# Patient Record
Sex: Male | Born: 1950 | Race: White | Hispanic: No | Marital: Married | Smoking: Never smoker
Health system: Southern US, Community
[De-identification: ages and names within clinical notes are randomized; demographics above are authoritative.]

## PROBLEM LIST (undated history)

## (undated) DIAGNOSIS — E785 Hyperlipidemia, unspecified: Secondary | ICD-10-CM

## (undated) DIAGNOSIS — I10 Essential (primary) hypertension: Secondary | ICD-10-CM

## (undated) DIAGNOSIS — R7303 Prediabetes: Secondary | ICD-10-CM

## (undated) DIAGNOSIS — N529 Male erectile dysfunction, unspecified: Secondary | ICD-10-CM

## (undated) DIAGNOSIS — M109 Gout, unspecified: Secondary | ICD-10-CM

## (undated) HISTORY — PX: REPLACEMENT TOTAL KNEE: SUR1224

## (undated) HISTORY — PX: TONSILLECTOMY: SUR1361

## (undated) HISTORY — DX: Essential (primary) hypertension: I10

## (undated) HISTORY — DX: Hyperlipidemia, unspecified: E78.5

## (undated) HISTORY — DX: Gout, unspecified: M10.9

## (undated) HISTORY — DX: Prediabetes: R73.03

## (undated) HISTORY — PX: VASECTOMY: SHX75

## (undated) HISTORY — PX: MEDIAL PARTIAL KNEE REPLACEMENT: SHX5965

## (undated) HISTORY — DX: Male erectile dysfunction, unspecified: N52.9

---

## 2009-02-10 ENCOUNTER — Emergency Department (HOSPITAL_BASED_OUTPATIENT_CLINIC_OR_DEPARTMENT_OTHER): Admission: EM | Admit: 2009-02-10 | Discharge: 2009-02-11 | Payer: Self-pay | Admitting: Emergency Medicine

## 2009-02-10 ENCOUNTER — Ambulatory Visit: Payer: Self-pay | Admitting: Diagnostic Radiology

## 2010-12-04 LAB — DIFFERENTIAL
Basophils Absolute: 0 10*3/uL (ref 0.0–0.1)
Eosinophils Relative: 0 % (ref 0–5)
Lymphocytes Relative: 22 % (ref 12–46)
Monocytes Absolute: 0.8 10*3/uL (ref 0.1–1.0)
Monocytes Relative: 10 % (ref 3–12)
Neutro Abs: 5.5 10*3/uL (ref 1.7–7.7)

## 2010-12-04 LAB — POCT CARDIAC MARKERS
CKMB, poc: <1 ng/mL — ABNORMAL LOW (ref 1.0–8.0)
CKMB, poc: <1 ng/mL — ABNORMAL LOW (ref 1.0–8.0)
Myoglobin, poc: 38.8 ng/mL (ref 12–200)
Troponin i, poc: <0.05 ng/mL (ref 0.00–0.09)
Troponin i, poc: <0.05 ng/mL (ref 0.00–0.09)

## 2010-12-04 LAB — BASIC METABOLIC PANEL
CO2: 31 mEq/L (ref 19–32)
Calcium: 9.4 mg/dL (ref 8.4–10.5)
GFR calc Af Amer: >60 mL/min (ref >60)
GFR calc non Af Amer: >60 mL/min (ref >60)
Glucose, Bld: 118 mg/dL — ABNORMAL HIGH (ref 70–99)
Potassium: 4 mEq/L (ref 3.5–5.1)
Sodium: 142 mEq/L (ref 135–145)

## 2010-12-04 LAB — CBC
HCT: 48.2 % (ref 39.0–52.0)
Hemoglobin: 16.1 g/dL (ref 13.0–17.0)
RBC: 5.52 MIL/uL (ref 4.22–5.81)
RDW: 12.4 % (ref 11.5–15.5)

## 2011-01-01 IMAGING — CR DG CHEST 1V PORT
1 series · 1 of 1 positions shown · non-contrast
Comparison: None

CLINICAL DATA: Heartburn.  Diarrhea.

PORTABLE CHEST - 1 VIEW

[view not recorded]
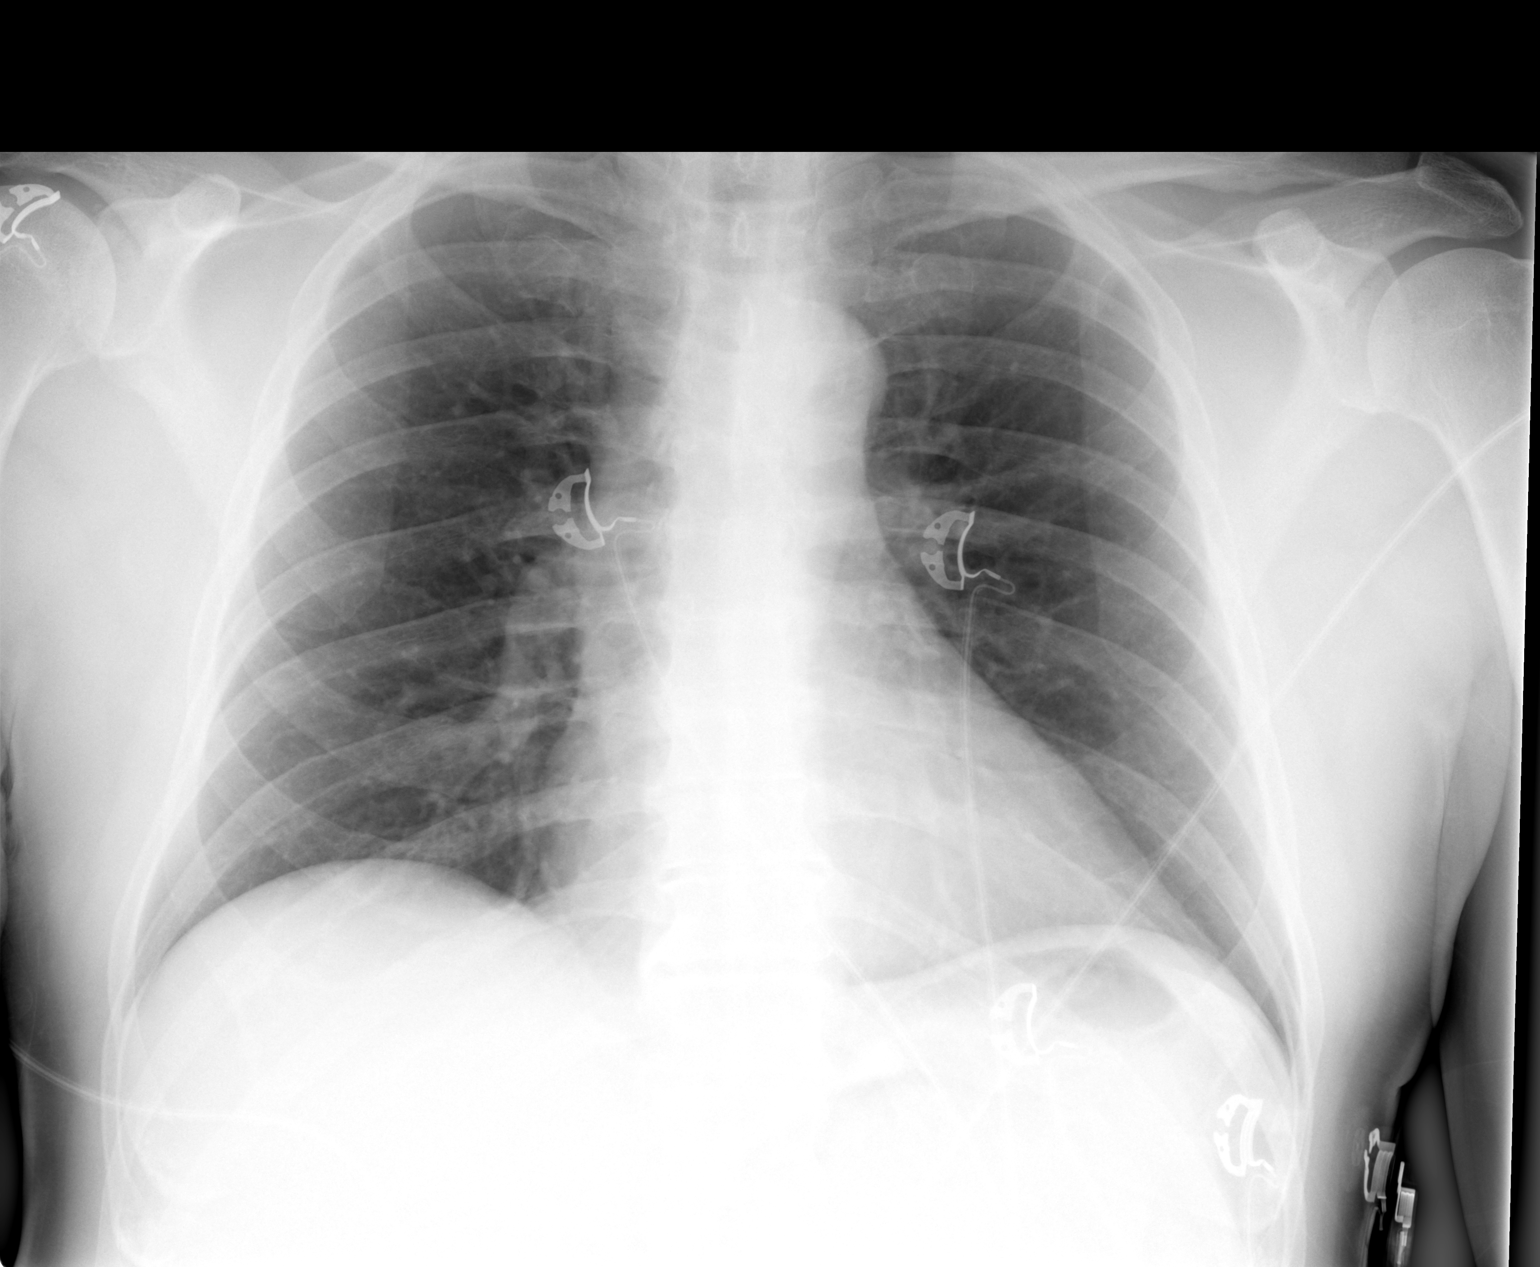

[1 of 1 positions shown; findings below may reference images not displayed]

FINDINGS: 2246 hours.  The heart size and mediastinal contours are
normal.  The lungs are clear.  There is no pleural effusion or
pneumothorax.  The osseous structures appear unremarkable.
IMPRESSION: No active cardiopulmonary process.

## 2014-08-30 ENCOUNTER — Encounter: Payer: Self-pay | Admitting: Podiatry

## 2014-08-30 ENCOUNTER — Ambulatory Visit (INDEPENDENT_AMBULATORY_CARE_PROVIDER_SITE_OTHER): Payer: BLUE CROSS/BLUE SHIELD

## 2014-08-30 ENCOUNTER — Ambulatory Visit (INDEPENDENT_AMBULATORY_CARE_PROVIDER_SITE_OTHER): Payer: BLUE CROSS/BLUE SHIELD | Admitting: Podiatry

## 2014-08-30 VITALS — BP 118/76 | HR 87 | Resp 16 | Ht 68.5 in | Wt 180.0 lb

## 2014-08-30 DIAGNOSIS — M779 Enthesopathy, unspecified: Secondary | ICD-10-CM | POA: Diagnosis not present

## 2014-08-30 DIAGNOSIS — M2041 Other hammer toe(s) (acquired), right foot: Secondary | ICD-10-CM

## 2014-08-30 NOTE — Progress Notes (Signed)
   Subjective:    Patient ID: Kristopher James, male    DOB: 1951/04/22, 64 y.o.   MRN: [id number]  HPI Comments: Right foot pain in the second toe joint, get some soreness in great toe joint. It has been on and off for a couple of years. It was swollen. Switched into different shoes and it has got better   Foot Pain      Review of Systems  All other systems reviewed and are negative.      Objective:   Physical Exam        Assessment & Plan:

## 2014-09-01 NOTE — Progress Notes (Signed)
Subjective:     Patient ID: Kristopher James, male   DOB: [birth year], 64 y.o.   MRN: [id number]  HPI patient presents with long-term inflammation and discomfort in the second metatarsophalangeal joint right with mild discomfort also around the big toe joint which gives him occasional pain   Review of Systems  All other systems reviewed and are negative.      Objective:   Physical Exam  Constitutional: He is oriented to person, place, and time.  Cardiovascular: Intact distal pulses.   Musculoskeletal: Normal range of motion.  Neurological: He is oriented to person, place, and time.  Skin: Skin is warm.  Nursing note and vitals reviewed.  neurovascular status is found to be intact with muscle strength adequate and range of motion subtalar and midtarsal joint within normal limits. Patient is noted to have swelling and discomfort around the second metatarsal phalangeal joint with enlargement of the joint surface and obvious signs of arthritis. 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 At this point I recommended orthotics to try to reduce the pressure against the joint with probable graphite insole to try to reduce the bending forces. I did explain the chronic nature of this condition and that long-term it may require ultimately some form of surgical procedure with joint implantation

## 2014-09-20 ENCOUNTER — Encounter: Payer: Self-pay | Admitting: Podiatry

## 2014-09-20 ENCOUNTER — Ambulatory Visit (INDEPENDENT_AMBULATORY_CARE_PROVIDER_SITE_OTHER): Payer: BLUE CROSS/BLUE SHIELD | Admitting: Podiatry

## 2014-09-20 VITALS — BP 118/76 | HR 87 | Resp 16

## 2014-09-20 DIAGNOSIS — M779 Enthesopathy, unspecified: Secondary | ICD-10-CM | POA: Diagnosis not present

## 2014-09-20 NOTE — Patient Instructions (Signed)

## 2014-09-22 NOTE — Progress Notes (Signed)
Subjective:     Patient ID: Kristopher James, male   DOB: 1950-10-22, 64 y.o.   MRN: [id number]  HPI patient states I'm still having moderate discomfort but improved in my right foot with new tennis shoes   Review of Systems     Objective:   Physical Exam Neurovascular status intact with diminished discomfort right second MPJ with fluid still noted upon palpation    Assessment:     Inflammatory capsulitis right    Plan:     Dispensed orthotics with instructions and gave instructions on continued physical therapy. Reappoint for us to recheck again

## 2014-11-01 ENCOUNTER — Encounter: Payer: Self-pay | Admitting: Podiatry

## 2014-11-01 ENCOUNTER — Ambulatory Visit (INDEPENDENT_AMBULATORY_CARE_PROVIDER_SITE_OTHER): Payer: BLUE CROSS/BLUE SHIELD | Admitting: Podiatry

## 2014-11-01 VITALS — BP 143/97 | HR 86 | Resp 86

## 2014-11-01 DIAGNOSIS — M2041 Other hammer toe(s) (acquired), right foot: Secondary | ICD-10-CM | POA: Diagnosis not present

## 2014-11-01 DIAGNOSIS — M779 Enthesopathy, unspecified: Secondary | ICD-10-CM

## 2014-11-01 NOTE — Progress Notes (Signed)
Subjective:     Patient ID: Kristopher James, male   DOB: [birth year], 64 y.o.   MRN: [id number]  HPI patient states I'm feeling quite a bit better with my right foot with occasional flareups and inability to wear the orthotics and certain types of shoes   Review of Systems     Objective:   Physical Exam Neurovascular status intact muscle strength adequate with significant diminishment of discomfort right second MPJ and mild discomfort on the right first MPJ with crepitus within the joint noted upon dorsiflexion    Assessment:     Continued inflammatory capsulitis with arthritis second MPJ right and hallux limitus deformity right    Plan:     Reviewed both conditions and today 1 ahead and dispensed a graphite insole to reduce plantar pressure and reappoint as symptoms indicate

## 2017-11-04 DIAGNOSIS — G8918 Other acute postprocedural pain: Secondary | ICD-10-CM | POA: Diagnosis not present

## 2017-11-04 DIAGNOSIS — M1712 Unilateral primary osteoarthritis, left knee: Secondary | ICD-10-CM | POA: Diagnosis not present

## 2017-11-08 DIAGNOSIS — M1712 Unilateral primary osteoarthritis, left knee: Secondary | ICD-10-CM | POA: Diagnosis not present

## 2017-11-11 DIAGNOSIS — M1712 Unilateral primary osteoarthritis, left knee: Secondary | ICD-10-CM | POA: Diagnosis not present

## 2017-11-12 DIAGNOSIS — Z96652 Presence of left artificial knee joint: Secondary | ICD-10-CM | POA: Diagnosis not present

## 2017-11-13 DIAGNOSIS — M1712 Unilateral primary osteoarthritis, left knee: Secondary | ICD-10-CM | POA: Diagnosis not present

## 2017-11-15 DIAGNOSIS — M1712 Unilateral primary osteoarthritis, left knee: Secondary | ICD-10-CM | POA: Diagnosis not present

## 2017-11-18 DIAGNOSIS — M1712 Unilateral primary osteoarthritis, left knee: Secondary | ICD-10-CM | POA: Diagnosis not present

## 2017-11-20 DIAGNOSIS — M1712 Unilateral primary osteoarthritis, left knee: Secondary | ICD-10-CM | POA: Diagnosis not present

## 2017-11-22 DIAGNOSIS — M1712 Unilateral primary osteoarthritis, left knee: Secondary | ICD-10-CM | POA: Diagnosis not present

## 2017-11-25 DIAGNOSIS — M1712 Unilateral primary osteoarthritis, left knee: Secondary | ICD-10-CM | POA: Diagnosis not present

## 2017-11-28 DIAGNOSIS — M1712 Unilateral primary osteoarthritis, left knee: Secondary | ICD-10-CM | POA: Diagnosis not present

## 2017-12-04 DIAGNOSIS — M1712 Unilateral primary osteoarthritis, left knee: Secondary | ICD-10-CM | POA: Diagnosis not present

## 2017-12-06 DIAGNOSIS — M1712 Unilateral primary osteoarthritis, left knee: Secondary | ICD-10-CM | POA: Diagnosis not present

## 2017-12-10 DIAGNOSIS — M1712 Unilateral primary osteoarthritis, left knee: Secondary | ICD-10-CM | POA: Diagnosis not present

## 2017-12-12 DIAGNOSIS — M1712 Unilateral primary osteoarthritis, left knee: Secondary | ICD-10-CM | POA: Diagnosis not present

## 2017-12-17 DIAGNOSIS — M1712 Unilateral primary osteoarthritis, left knee: Secondary | ICD-10-CM | POA: Diagnosis not present

## 2017-12-19 DIAGNOSIS — M1712 Unilateral primary osteoarthritis, left knee: Secondary | ICD-10-CM | POA: Diagnosis not present

## 2017-12-21 DIAGNOSIS — M1712 Unilateral primary osteoarthritis, left knee: Secondary | ICD-10-CM | POA: Diagnosis not present

## 2017-12-24 DIAGNOSIS — M1712 Unilateral primary osteoarthritis, left knee: Secondary | ICD-10-CM | POA: Diagnosis not present

## 2017-12-26 DIAGNOSIS — M1712 Unilateral primary osteoarthritis, left knee: Secondary | ICD-10-CM | POA: Diagnosis not present

## 2017-12-31 DIAGNOSIS — M1712 Unilateral primary osteoarthritis, left knee: Secondary | ICD-10-CM | POA: Diagnosis not present

## 2018-01-02 DIAGNOSIS — M1712 Unilateral primary osteoarthritis, left knee: Secondary | ICD-10-CM | POA: Diagnosis not present

## 2018-01-07 DIAGNOSIS — M1712 Unilateral primary osteoarthritis, left knee: Secondary | ICD-10-CM | POA: Diagnosis not present

## 2018-01-10 DIAGNOSIS — M1712 Unilateral primary osteoarthritis, left knee: Secondary | ICD-10-CM | POA: Diagnosis not present

## 2018-01-13 DIAGNOSIS — M1712 Unilateral primary osteoarthritis, left knee: Secondary | ICD-10-CM | POA: Diagnosis not present

## 2018-01-21 DIAGNOSIS — M1712 Unilateral primary osteoarthritis, left knee: Secondary | ICD-10-CM | POA: Diagnosis not present

## 2018-05-14 DIAGNOSIS — I10 Essential (primary) hypertension: Secondary | ICD-10-CM | POA: Diagnosis not present

## 2018-05-14 DIAGNOSIS — Z0000 Encounter for general adult medical examination without abnormal findings: Secondary | ICD-10-CM | POA: Diagnosis not present

## 2018-05-14 DIAGNOSIS — Z1211 Encounter for screening for malignant neoplasm of colon: Secondary | ICD-10-CM | POA: Diagnosis not present

## 2018-05-14 DIAGNOSIS — E7800 Pure hypercholesterolemia, unspecified: Secondary | ICD-10-CM | POA: Diagnosis not present

## 2018-06-12 DIAGNOSIS — H2513 Age-related nuclear cataract, bilateral: Secondary | ICD-10-CM | POA: Diagnosis not present

## 2018-09-10 DIAGNOSIS — G8929 Other chronic pain: Secondary | ICD-10-CM | POA: Diagnosis not present

## 2018-09-10 DIAGNOSIS — M1711 Unilateral primary osteoarthritis, right knee: Secondary | ICD-10-CM | POA: Diagnosis not present

## 2019-05-18 DIAGNOSIS — I10 Essential (primary) hypertension: Secondary | ICD-10-CM | POA: Diagnosis not present

## 2019-05-18 DIAGNOSIS — M1000 Idiopathic gout, unspecified site: Secondary | ICD-10-CM | POA: Diagnosis not present

## 2019-05-18 DIAGNOSIS — E7800 Pure hypercholesterolemia, unspecified: Secondary | ICD-10-CM | POA: Diagnosis not present

## 2019-05-18 DIAGNOSIS — Z0000 Encounter for general adult medical examination without abnormal findings: Secondary | ICD-10-CM | POA: Diagnosis not present

## 2019-11-18 DIAGNOSIS — Z0120 Encounter for dental examination and cleaning without abnormal findings: Secondary | ICD-10-CM | POA: Diagnosis not present

## 2020-03-28 DIAGNOSIS — S91332A Puncture wound without foreign body, left foot, initial encounter: Secondary | ICD-10-CM | POA: Diagnosis not present

## 2020-05-19 DIAGNOSIS — Z0120 Encounter for dental examination and cleaning without abnormal findings: Secondary | ICD-10-CM | POA: Diagnosis not present

## 2020-06-24 DIAGNOSIS — Z0000 Encounter for general adult medical examination without abnormal findings: Secondary | ICD-10-CM | POA: Diagnosis not present

## 2020-06-24 DIAGNOSIS — N529 Male erectile dysfunction, unspecified: Secondary | ICD-10-CM | POA: Diagnosis not present

## 2020-06-24 DIAGNOSIS — E7800 Pure hypercholesterolemia, unspecified: Secondary | ICD-10-CM | POA: Diagnosis not present

## 2020-06-24 DIAGNOSIS — I10 Essential (primary) hypertension: Secondary | ICD-10-CM | POA: Diagnosis not present

## 2020-11-21 DIAGNOSIS — Z01812 Encounter for preprocedural laboratory examination: Secondary | ICD-10-CM | POA: Diagnosis not present

## 2020-11-23 DIAGNOSIS — D124 Benign neoplasm of descending colon: Secondary | ICD-10-CM | POA: Diagnosis not present

## 2020-11-23 DIAGNOSIS — D123 Benign neoplasm of transverse colon: Secondary | ICD-10-CM | POA: Diagnosis not present

## 2020-11-23 DIAGNOSIS — D122 Benign neoplasm of ascending colon: Secondary | ICD-10-CM | POA: Diagnosis not present

## 2020-11-23 DIAGNOSIS — K621 Rectal polyp: Secondary | ICD-10-CM | POA: Diagnosis not present

## 2020-11-23 DIAGNOSIS — K5730 Diverticulosis of large intestine without perforation or abscess without bleeding: Secondary | ICD-10-CM | POA: Diagnosis not present

## 2020-11-23 DIAGNOSIS — Z86010 Personal history of colonic polyps: Secondary | ICD-10-CM | POA: Diagnosis not present

## 2020-11-25 DIAGNOSIS — K621 Rectal polyp: Secondary | ICD-10-CM | POA: Diagnosis not present

## 2020-11-25 DIAGNOSIS — D123 Benign neoplasm of transverse colon: Secondary | ICD-10-CM | POA: Diagnosis not present

## 2020-11-25 DIAGNOSIS — D124 Benign neoplasm of descending colon: Secondary | ICD-10-CM | POA: Diagnosis not present

## 2020-11-25 DIAGNOSIS — D122 Benign neoplasm of ascending colon: Secondary | ICD-10-CM | POA: Diagnosis not present

## 2020-12-27 DIAGNOSIS — H524 Presbyopia: Secondary | ICD-10-CM | POA: Diagnosis not present

## 2020-12-27 DIAGNOSIS — H5203 Hypermetropia, bilateral: Secondary | ICD-10-CM | POA: Diagnosis not present

## 2020-12-27 DIAGNOSIS — H52223 Regular astigmatism, bilateral: Secondary | ICD-10-CM | POA: Diagnosis not present

## 2020-12-27 DIAGNOSIS — H2513 Age-related nuclear cataract, bilateral: Secondary | ICD-10-CM | POA: Diagnosis not present

## 2020-12-31 DIAGNOSIS — Z20822 Contact with and (suspected) exposure to covid-19: Secondary | ICD-10-CM | POA: Diagnosis not present

## 2021-01-31 DIAGNOSIS — M1711 Unilateral primary osteoarthritis, right knee: Secondary | ICD-10-CM | POA: Diagnosis not present

## 2021-06-27 DIAGNOSIS — M1000 Idiopathic gout, unspecified site: Secondary | ICD-10-CM | POA: Diagnosis not present

## 2021-06-27 DIAGNOSIS — E7800 Pure hypercholesterolemia, unspecified: Secondary | ICD-10-CM | POA: Diagnosis not present

## 2021-06-27 DIAGNOSIS — I10 Essential (primary) hypertension: Secondary | ICD-10-CM | POA: Diagnosis not present

## 2021-06-27 DIAGNOSIS — R972 Elevated prostate specific antigen [PSA]: Secondary | ICD-10-CM | POA: Diagnosis not present

## 2021-08-10 DIAGNOSIS — M1711 Unilateral primary osteoarthritis, right knee: Secondary | ICD-10-CM | POA: Diagnosis not present

## 2021-09-04 DIAGNOSIS — G8918 Other acute postprocedural pain: Secondary | ICD-10-CM | POA: Diagnosis not present

## 2021-09-04 DIAGNOSIS — Z96651 Presence of right artificial knee joint: Secondary | ICD-10-CM | POA: Diagnosis not present

## 2021-09-04 DIAGNOSIS — M1711 Unilateral primary osteoarthritis, right knee: Secondary | ICD-10-CM | POA: Diagnosis not present

## 2021-09-12 DIAGNOSIS — M1711 Unilateral primary osteoarthritis, right knee: Secondary | ICD-10-CM | POA: Diagnosis not present

## 2021-09-14 DIAGNOSIS — M25461 Effusion, right knee: Secondary | ICD-10-CM | POA: Diagnosis not present

## 2021-09-14 DIAGNOSIS — Z96651 Presence of right artificial knee joint: Secondary | ICD-10-CM | POA: Diagnosis not present

## 2021-09-15 DIAGNOSIS — M1711 Unilateral primary osteoarthritis, right knee: Secondary | ICD-10-CM | POA: Diagnosis not present

## 2021-09-18 DIAGNOSIS — M1711 Unilateral primary osteoarthritis, right knee: Secondary | ICD-10-CM | POA: Diagnosis not present

## 2021-09-20 DIAGNOSIS — M1711 Unilateral primary osteoarthritis, right knee: Secondary | ICD-10-CM | POA: Diagnosis not present

## 2021-09-22 DIAGNOSIS — M1711 Unilateral primary osteoarthritis, right knee: Secondary | ICD-10-CM | POA: Diagnosis not present

## 2021-09-25 DIAGNOSIS — M1711 Unilateral primary osteoarthritis, right knee: Secondary | ICD-10-CM | POA: Diagnosis not present

## 2021-09-27 DIAGNOSIS — M1711 Unilateral primary osteoarthritis, right knee: Secondary | ICD-10-CM | POA: Diagnosis not present

## 2021-09-29 DIAGNOSIS — M1711 Unilateral primary osteoarthritis, right knee: Secondary | ICD-10-CM | POA: Diagnosis not present

## 2021-10-02 DIAGNOSIS — M1711 Unilateral primary osteoarthritis, right knee: Secondary | ICD-10-CM | POA: Diagnosis not present

## 2021-10-04 DIAGNOSIS — M1711 Unilateral primary osteoarthritis, right knee: Secondary | ICD-10-CM | POA: Diagnosis not present

## 2021-10-06 DIAGNOSIS — M1711 Unilateral primary osteoarthritis, right knee: Secondary | ICD-10-CM | POA: Diagnosis not present

## 2021-10-10 DIAGNOSIS — M1711 Unilateral primary osteoarthritis, right knee: Secondary | ICD-10-CM | POA: Diagnosis not present

## 2021-10-12 DIAGNOSIS — M1711 Unilateral primary osteoarthritis, right knee: Secondary | ICD-10-CM | POA: Diagnosis not present

## 2021-10-17 DIAGNOSIS — M1711 Unilateral primary osteoarthritis, right knee: Secondary | ICD-10-CM | POA: Diagnosis not present

## 2021-10-19 DIAGNOSIS — M1711 Unilateral primary osteoarthritis, right knee: Secondary | ICD-10-CM | POA: Diagnosis not present

## 2021-10-23 DIAGNOSIS — M1711 Unilateral primary osteoarthritis, right knee: Secondary | ICD-10-CM | POA: Diagnosis not present

## 2021-10-26 DIAGNOSIS — M1711 Unilateral primary osteoarthritis, right knee: Secondary | ICD-10-CM | POA: Diagnosis not present

## 2021-10-31 DIAGNOSIS — M1711 Unilateral primary osteoarthritis, right knee: Secondary | ICD-10-CM | POA: Diagnosis not present

## 2021-11-03 DIAGNOSIS — M1711 Unilateral primary osteoarthritis, right knee: Secondary | ICD-10-CM | POA: Diagnosis not present

## 2021-11-06 DIAGNOSIS — M1711 Unilateral primary osteoarthritis, right knee: Secondary | ICD-10-CM | POA: Diagnosis not present

## 2021-11-08 DIAGNOSIS — M1711 Unilateral primary osteoarthritis, right knee: Secondary | ICD-10-CM | POA: Diagnosis not present

## 2021-11-13 DIAGNOSIS — M1711 Unilateral primary osteoarthritis, right knee: Secondary | ICD-10-CM | POA: Diagnosis not present

## 2021-11-16 DIAGNOSIS — M1711 Unilateral primary osteoarthritis, right knee: Secondary | ICD-10-CM | POA: Diagnosis not present

## 2021-11-20 DIAGNOSIS — M1711 Unilateral primary osteoarthritis, right knee: Secondary | ICD-10-CM | POA: Diagnosis not present

## 2021-11-22 DIAGNOSIS — M1711 Unilateral primary osteoarthritis, right knee: Secondary | ICD-10-CM | POA: Diagnosis not present

## 2021-11-27 DIAGNOSIS — M1711 Unilateral primary osteoarthritis, right knee: Secondary | ICD-10-CM | POA: Diagnosis not present

## 2021-11-29 DIAGNOSIS — M1711 Unilateral primary osteoarthritis, right knee: Secondary | ICD-10-CM | POA: Diagnosis not present

## 2021-12-04 DIAGNOSIS — M1711 Unilateral primary osteoarthritis, right knee: Secondary | ICD-10-CM | POA: Diagnosis not present

## 2021-12-07 DIAGNOSIS — M1711 Unilateral primary osteoarthritis, right knee: Secondary | ICD-10-CM | POA: Diagnosis not present

## 2021-12-11 DIAGNOSIS — M1711 Unilateral primary osteoarthritis, right knee: Secondary | ICD-10-CM | POA: Diagnosis not present

## 2022-04-12 DIAGNOSIS — H35722 Serous detachment of retinal pigment epithelium, left eye: Secondary | ICD-10-CM | POA: Diagnosis not present

## 2022-04-12 DIAGNOSIS — H25011 Cortical age-related cataract, right eye: Secondary | ICD-10-CM | POA: Diagnosis not present

## 2022-04-12 DIAGNOSIS — H25042 Posterior subcapsular polar age-related cataract, left eye: Secondary | ICD-10-CM | POA: Diagnosis not present

## 2022-04-12 DIAGNOSIS — H2513 Age-related nuclear cataract, bilateral: Secondary | ICD-10-CM | POA: Diagnosis not present

## 2022-06-05 DIAGNOSIS — H35722 Serous detachment of retinal pigment epithelium, left eye: Secondary | ICD-10-CM | POA: Diagnosis not present

## 2022-07-02 DIAGNOSIS — R251 Tremor, unspecified: Secondary | ICD-10-CM | POA: Diagnosis not present

## 2022-07-02 DIAGNOSIS — E7800 Pure hypercholesterolemia, unspecified: Secondary | ICD-10-CM | POA: Diagnosis not present

## 2022-07-02 DIAGNOSIS — I10 Essential (primary) hypertension: Secondary | ICD-10-CM | POA: Diagnosis not present

## 2022-07-02 DIAGNOSIS — Z125 Encounter for screening for malignant neoplasm of prostate: Secondary | ICD-10-CM | POA: Diagnosis not present

## 2022-07-02 DIAGNOSIS — Z0000 Encounter for general adult medical examination without abnormal findings: Secondary | ICD-10-CM | POA: Diagnosis not present

## 2022-08-02 ENCOUNTER — Encounter: Payer: Self-pay | Admitting: *Deleted

## 2022-08-06 ENCOUNTER — Encounter: Payer: Self-pay | Admitting: Neurology

## 2022-08-06 ENCOUNTER — Ambulatory Visit: Payer: Medicare Other | Admitting: Neurology

## 2022-08-06 VITALS — BP 164/91 | HR 74 | Ht 68.0 in | Wt 196.4 lb

## 2022-08-06 DIAGNOSIS — G250 Essential tremor: Secondary | ICD-10-CM

## 2022-08-06 MED ORDER — PROPRANOLOL HCL 20 MG PO TABS: 20.0000 mg | ORAL_TABLET | Freq: Three times a day (TID) | ORAL | 2 refills | Status: AC | PRN

## 2022-08-06 NOTE — Progress Notes (Signed)
Subjective:    Patient ID: Kristopher James is a 71 y.o. male.  HPI    Star Age, MD, PhD [facility] [address], [address] [place], [name] [address]  Dear Dr. Olen Pel,  I saw your patient, Kristopher James, upon your kind request in my neurologic clinic today for initial consultation of his tremors.  The patient is unaccompanied today.  As you know, Kristopher James is a 71 year old male with an underlying medical history of hypertension, hyperlipidemia, prediabetes, gout, ED, status post partial left knee replacement, status post right total knee replacement, and overweight state, who reports a longstanding history of hand tremors of approximately 30 years duration.  As he recalls, his father had tremors.  He has 2 grown children, son is 47 and daughter just turned 60 and she recently mentioned to him that she may also have the same type of tremor.  He has been checked out for tremor several times in different states as he lived in different places over time and travel to different places over time for his job, he has been told several times that he has essential tremor.  He is not particularly worried that he reports that his wife would like him to be checked.  He has never actually tried any medication such as a beta-blocker.  This has been mentioned to him before but he never felt the need for a symptomatic medication.  He has noticed progression particularly in the past year.  He has reduced his caffeine intake.  He drinks 1 cup of regular coffee and 1 cup of decaf coffee on an average morning.  He does like to drink diet green tea, switched from diet soda.  He tries to hydrate well with water but could do better by self admission.  He does drink alcohol every day, usually 1 glass of wine and 1 bourbon.  While he has noticed tremor reduction with alcohol, he has never used alcohol as a therapeutic agent.  He is retired, he worked in Pharmacologist, and [employer].   He lives with his wife, they have 2 grown children.  He likes to travel.  He has not noticed any problems with his balance, no recent falls, he tries to stay active, he plays golf. He has 1 brother who does not have any tremor. I reviewed your office note from 07/02/2022.  He had blood work at the time including CMP, lipid panel.  I reviewed the results, lipid panel showed total cholesterol of 192, triglycerides 112, LDL 105, PSA in the normal range, CMP showed glucose of 113, BUN 17, creatinine 0.82, alk phos 69, AST 23, ALT 35.  He has not tried any medication for the tremor. He sleeps well, some snoring reported but no sleep disruption, feels well rested.  No witnessed apneas.  Denies waking up with a sense of gasping for air.  His Past Medical History Is Significant For: Past Medical History:  Diagnosis Date   ED (erectile dysfunction)    Gout    Hyperlipidemia    Hypertension    Prediabetes     His Past Surgical History Is Significant For: Past Surgical History:  Procedure Laterality Date   MEDIAL PARTIAL KNEE REPLACEMENT Left    REPLACEMENT TOTAL KNEE Right    TONSILLECTOMY     VASECTOMY      His Family History Is Significant For: Family History  Problem Relation Age of Onset   Cancer Mother    Heart attack Father  Tremor Father    Hypertension Brother     His Social History Is Significant For: Social History   Socioeconomic History   Marital status: Married    Spouse name: Not on file   Number of children: Not on file   Years of education: Not on file   Highest education level: Not on file  Occupational History   Not on file  Tobacco Use   Smoking status: Never   Smokeless tobacco: Never  Vaping Use   Vaping Use: Never used  Substance and Sexual Activity   Alcohol use: Yes    Comment: 2 drinks per night   Drug use: Never   Sexual activity: Not on file  Other Topics Concern   Not on file  Social History Narrative   Caffiene coffee 2 cups   Education:  masters in Psychologist, forensic   Retired 19 yrs ago   Social Determinants of Radio broadcast assistant Strain: Not on file  Food Insecurity: Not on file  Transportation Needs: Not on file  Physical Activity: Not on file  Stress: Not on file  Social Connections: Not on file    His Allergies Are:  No Known Allergies:   His Current Medications Are:  Outpatient Encounter Medications as of 08/06/2022  Medication Sig   amLODipine-benazepril (LOTREL) 5-10 MG per capsule    atorvastatin (LIPITOR) 20 MG tablet    glucosamine-chondroitin 500-400 MG tablet Take 1 tablet by mouth daily at 6 (six) AM.   Multiple Vitamins-Minerals (CENTRUM ADULT PO) Take by mouth daily at 6 (six) AM.   colchicine 0.6 MG tablet Take 0.6 mg by mouth daily. (Patient not taking: Reported on 08/06/2022)   [DISCONTINUED] aspirin 81 MG tablet Take 81 mg by mouth daily. (Patient not taking: Reported on 08/06/2022)   No facility-administered encounter medications on file as of 08/06/2022.  :   Review of Systems:  Out of a complete 14 point review of systems, all are reviewed and negative with the exception of these symptoms as listed below:  Review of Systems  Neurological:        Worsening tremors.  (Started about 30 yrs ago). R arm/hand.      Objective:  Neurological Exam  Physical Exam Physical Examination:   Vitals:   08/06/22 0810  BP: (!) 164/91  Pulse: 74    General Examination: The patient is a very pleasant 71 y.o. male in no acute distress. He appears well-developed and well-nourished and well groomed.   HEENT: Normocephalic, atraumatic, pupils are equal, round and reactive to light, correct of eyeglasses in place.  Extraocular tracking is good without limitation to gaze excursion or nystagmus noted. Hearing is grossly intact. Face is symmetric with normal facial animation. Speech is clear with no dysarthria noted. There is no hypophonia. There is no lip, neck/head, jaw or voice  tremor. Neck is supple with full range of passive and active motion. There are no carotid bruits on auscultation. Oropharynx exam reveals: moderate mouth dryness, adequate dental hygiene. Tongue protrudes centrally and palate elevates symmetrically.   Chest: Clear to auscultation without wheezing, rhonchi or crackles noted.  Heart: S1+S2+0, regular and normal without murmurs, rubs or gallops noted.   Abdomen: Soft, non-tender and non-distended.  Extremities: There is no pitting edema in the distal lower extremities bilaterally.   Skin: Warm and dry without trophic changes noted.   Musculoskeletal: exam reveals no obvious joint deformities.   Neurologically:  Mental status: The patient is awake, alert and oriented  in all 4 spheres. His immediate and remote memory, attention, language skills and fund of knowledge are appropriate. There is no evidence of aphasia, agnosia, apraxia or anomia. Speech is clear with normal prosody and enunciation. Thought process is linear. Mood is normal and affect is normal.  Cranial nerves II - XII are as described above under HEENT exam.  Motor exam: Normal bulk, strength and tone is noted. There is no resting tremor.  He has a mild to moderate postural tremor and action tremor in both upper extremities, no lower extremity tremor.    On 08/06/2022: On Archimedes spiral drawing he has mild to moderate trembling with the right hand, moderate with the left hand, handwriting is legible, mildly tremulous, not micrographic.    Fine motor skills and coordination: Normal finger taps, hand movements and rapid alternating patting with both upper extremities, normal foot taps bilaterally.  Reflexes are 2+ in the upper extremities, absent in the knees, 1+ in both ankles.  Cerebellar testing: No dysmetria or intention tremor. There is no truncal or gait ataxia.  Sensory exam: intact to light touch in the upper and lower extremities.  Gait, station and balance: He stands  easily. No veering to one side is noted. No leaning to one side is noted. Posture is age-appropriate and stance is narrow based. Gait shows normal stride length and normal pace. No problems turning are noted.  Romberg is negative.  Assessment and Plan:  In summary, Kristopher James is a very pleasant 71 y.o.-year old male with an underlying medical history of hypertension, hyperlipidemia, prediabetes, gout, ED, status post partial left knee replacement, status post right total knee replacement, and overweight state, who presents for evaluation of his tremor disorder of over 30 years duration with a family history of tremors in his father and potentially also in his daughter.  History and examination are in keeping with essential tremor.  I did not detect any evidence of parkinsonism, history is also not supportive of parkinsonian symptoms.  He was reassured in that regard.  We talked about tremor triggers and alleviating factors, we also talked about symptomatic treatment options.  He knows that there is no curative options.  He has never tried a beta-blocker and would be amenable to starting on a low-dose beta-blocker, even only as needed.  He is advised to stay well-hydrated and well rested and follow-up with you on a scheduled basis.  If he does well with propranolol, we can have him follow-up with you and request refills on his beta-blocker through your office if possible.  He knows not to utilize alcohol as a therapeutic agent.  I suggested he start Inderal/propranolol 20 mg strength immediate release take 1 pill once daily as needed, may take up to 1 pill 3 times a day as needed.  We talked about possible side effects and limitations of the medication, he was given a new prescription and instructions in MyChart.  He is advised to follow-up routinely with your office, I would be happy to see him back as needed.  I did not see a recent TSH, with his next blood draw, please make sure his TSH/thyroid function are  within normal limits.  I answered all his questions today and he was in agreement.  Thank you very much for allowing me to participate in the care of this nice patient. If I can be of any further assistance to you please do not hesitate to call me at [phone].  Sincerely,   Star Age, MD, PhD

## 2022-08-06 NOTE — Patient Instructions (Addendum)
You have a tremor of both hands, likely essential tremor.  I do not see any signs or symptoms of parkinson's like disease or what we call parkinsonism.  For your tremor, we can try you on low-dose propranolol which is a beta-blocker.    You can use the beta-blocker as needed as well, start with 1 pill once daily and gradually build it up to 1 pill 3 times daily.  The medication is also called Inderal (generic name: propranolol) 20 mg strength: take 1 pill up to 3 times a day as needed.   Common side effect reported are: lethargy, sedation, low blood pressure and low pulse rate. Please monitor your BP and Pulse every few days, if your pulse drops lower than 55 you may feel bad and we may have to adjust your dose. Same with your BP below 110/55.   Please remember, that any kind of tremor may be exacerbated by anxiety, anger, nervousness, excitement, dehydration, sleep deprivation, thyroid dysfunction, by caffeine, and low blood sugar values or blood sugar fluctuations. Some medications can exacerbate tremors, this includes certain asthma or COPD medications and certain antidepressants.   For now, you can follow-up with your primary care, if Dr. Lenise Arena is comfortable maintaining your prescription for propranolol, he can provide refills in the future.  Please make sure that your thyroid function is checked with the next blood work.

## 2022-08-09 DIAGNOSIS — Z23 Encounter for immunization: Secondary | ICD-10-CM | POA: Diagnosis not present

## 2022-08-30 ENCOUNTER — Encounter: Payer: Self-pay | Admitting: *Deleted

## 2022-08-30 ENCOUNTER — Telehealth: Payer: Self-pay | Admitting: *Deleted

## 2022-08-30 NOTE — Patient Outreach (Signed)
  Care Coordination   Initial Visit Note   08/30/2022 Name: Kristopher James MRN: [id number] DOB: [birth year]  Kristopher James is a 72 y.o. year old male who sees Briscoe Deutscher, MD for primary care. I spoke with  Ilean China by phone today.  What matters to the patients health and wellness today?  No needs    Goals Addressed             This Visit's Progress    COMPLETED: care coordination activity       Care Coordination Interventions: Reviewed medications with patient and discussed adherence with no needed refills Assessed social determinant of health barriers Educated on care management services with no needs presented at this time.          SDOH assessments and interventions completed:  Yes  SDOH Interventions Today    Flowsheet Row Most Recent Value  SDOH Interventions   Food Insecurity Interventions Intervention Not Indicated  Housing Interventions Intervention Not Indicated  Transportation Interventions Intervention Not Indicated  Utilities Interventions Intervention Not Indicated        Care Coordination Interventions:  Yes, provided   Follow up plan: No further intervention required.   Encounter Outcome:  Pt. Visit Completed   Raina Mina, RN Care Management Coordinator [place] Office [phone]

## 2022-08-30 NOTE — Patient Instructions (Signed)
Visit Information  Thank you for taking time to visit with me today. Please don't hesitate to contact me if I can be of assistance to you.   Following are the goals we discussed today:   Goals Addressed             This Visit's Progress    COMPLETED: care coordination activity       Care Coordination Interventions: Reviewed medications with patient and discussed adherence with no needed refills Assessed social determinant of health barriers Educated on care management services with no needs presented at this time.          Please call the care guide team at [phone] if you need to cancel or reschedule your appointment.   If you are experiencing a Mental Health or [place] or need someone to talk to, please call the Suicide and Crisis Lifeline: 988  The patient verbalized understanding of instructions, educational materials, and care plan provided today and DECLINED offer to receive copy of patient instructions, educational materials, and care plan.   No further follow up required: No follow needs  Raina Mina, RN Care Management Coordinator [place] Office [phone]

## 2022-09-03 DIAGNOSIS — K080 Exfoliation of teeth due to systemic causes: Secondary | ICD-10-CM | POA: Diagnosis not present

## 2022-09-07 DIAGNOSIS — Z96651 Presence of right artificial knee joint: Secondary | ICD-10-CM | POA: Diagnosis not present

## 2022-11-20 DIAGNOSIS — H35722 Serous detachment of retinal pigment epithelium, left eye: Secondary | ICD-10-CM | POA: Diagnosis not present

## 2023-01-02 DIAGNOSIS — K080 Exfoliation of teeth due to systemic causes: Secondary | ICD-10-CM | POA: Diagnosis not present

## 2023-05-21 DIAGNOSIS — H5203 Hypermetropia, bilateral: Secondary | ICD-10-CM | POA: Diagnosis not present

## 2023-05-21 DIAGNOSIS — H353121 Nonexudative age-related macular degeneration, left eye, early dry stage: Secondary | ICD-10-CM | POA: Diagnosis not present

## 2023-05-21 DIAGNOSIS — H52223 Regular astigmatism, bilateral: Secondary | ICD-10-CM | POA: Diagnosis not present

## 2023-05-21 DIAGNOSIS — H25011 Cortical age-related cataract, right eye: Secondary | ICD-10-CM | POA: Diagnosis not present

## 2023-05-21 DIAGNOSIS — H2513 Age-related nuclear cataract, bilateral: Secondary | ICD-10-CM | POA: Diagnosis not present

## 2023-05-21 DIAGNOSIS — H524 Presbyopia: Secondary | ICD-10-CM | POA: Diagnosis not present

## 2023-05-21 DIAGNOSIS — H35722 Serous detachment of retinal pigment epithelium, left eye: Secondary | ICD-10-CM | POA: Diagnosis not present

## 2023-06-19 DIAGNOSIS — K080 Exfoliation of teeth due to systemic causes: Secondary | ICD-10-CM | POA: Diagnosis not present

## 2023-07-10 DIAGNOSIS — R7301 Impaired fasting glucose: Secondary | ICD-10-CM | POA: Diagnosis not present

## 2023-07-10 DIAGNOSIS — M1000 Idiopathic gout, unspecified site: Secondary | ICD-10-CM | POA: Diagnosis not present

## 2023-07-10 DIAGNOSIS — I10 Essential (primary) hypertension: Secondary | ICD-10-CM | POA: Diagnosis not present

## 2023-07-10 DIAGNOSIS — Z0000 Encounter for general adult medical examination without abnormal findings: Secondary | ICD-10-CM | POA: Diagnosis not present

## 2023-07-10 DIAGNOSIS — E7800 Pure hypercholesterolemia, unspecified: Secondary | ICD-10-CM | POA: Diagnosis not present

## 2023-07-10 DIAGNOSIS — Z125 Encounter for screening for malignant neoplasm of prostate: Secondary | ICD-10-CM | POA: Diagnosis not present

## 2023-07-11 DIAGNOSIS — R7301 Impaired fasting glucose: Secondary | ICD-10-CM | POA: Diagnosis not present

## 2023-10-07 DIAGNOSIS — R972 Elevated prostate specific antigen [PSA]: Secondary | ICD-10-CM | POA: Diagnosis not present

## 2023-11-19 DIAGNOSIS — N400 Enlarged prostate without lower urinary tract symptoms: Secondary | ICD-10-CM | POA: Diagnosis not present

## 2023-11-19 DIAGNOSIS — R972 Elevated prostate specific antigen [PSA]: Secondary | ICD-10-CM | POA: Diagnosis not present

## 2023-11-19 DIAGNOSIS — N5201 Erectile dysfunction due to arterial insufficiency: Secondary | ICD-10-CM | POA: Diagnosis not present

## 2023-12-31 DIAGNOSIS — K080 Exfoliation of teeth due to systemic causes: Secondary | ICD-10-CM | POA: Diagnosis not present

## 2024-04-02 DIAGNOSIS — R251 Tremor, unspecified: Secondary | ICD-10-CM | POA: Diagnosis not present

## 2024-04-02 DIAGNOSIS — R972 Elevated prostate specific antigen [PSA]: Secondary | ICD-10-CM | POA: Diagnosis not present

## 2024-04-02 DIAGNOSIS — E7800 Pure hypercholesterolemia, unspecified: Secondary | ICD-10-CM | POA: Diagnosis not present

## 2024-04-02 DIAGNOSIS — I10 Essential (primary) hypertension: Secondary | ICD-10-CM | POA: Diagnosis not present

## 2024-05-03 DIAGNOSIS — R319 Hematuria, unspecified: Secondary | ICD-10-CM | POA: Diagnosis not present

## 2024-05-03 DIAGNOSIS — R1010 Upper abdominal pain, unspecified: Secondary | ICD-10-CM | POA: Diagnosis not present

## 2024-05-12 DIAGNOSIS — R972 Elevated prostate specific antigen [PSA]: Secondary | ICD-10-CM | POA: Diagnosis not present

## 2024-05-19 DIAGNOSIS — R972 Elevated prostate specific antigen [PSA]: Secondary | ICD-10-CM | POA: Diagnosis not present

## 2024-05-19 DIAGNOSIS — N400 Enlarged prostate without lower urinary tract symptoms: Secondary | ICD-10-CM | POA: Diagnosis not present

## 2024-05-19 DIAGNOSIS — N5201 Erectile dysfunction due to arterial insufficiency: Secondary | ICD-10-CM | POA: Diagnosis not present

## 2024-05-20 ENCOUNTER — Other Ambulatory Visit: Payer: Self-pay | Admitting: Urology

## 2024-05-20 ENCOUNTER — Encounter: Payer: Self-pay | Admitting: Urology

## 2024-05-20 DIAGNOSIS — R972 Elevated prostate specific antigen [PSA]: Secondary | ICD-10-CM

## 2024-07-08 DIAGNOSIS — K080 Exfoliation of teeth due to systemic causes: Secondary | ICD-10-CM | POA: Diagnosis not present

## 2024-07-14 ENCOUNTER — Ambulatory Visit
Admission: RE | Admit: 2024-07-14 | Discharge: 2024-07-14 | Disposition: A | Discharge status: Home / Self Care | Source: Ambulatory Visit | Attending: Urology | Admitting: Urology

## 2024-07-14 DIAGNOSIS — R972 Elevated prostate specific antigen [PSA]: Secondary | ICD-10-CM

## 2024-07-14 MED ORDER — GADOPICLENOL 0.5 MMOL/ML IV SOLN
9.0000 mL | Freq: Once | INTRAVENOUS | Status: AC | PRN
  Administered 2024-07-14: 9 mL via INTRAVENOUS

## 2024-07-15 DIAGNOSIS — M1000 Idiopathic gout, unspecified site: Secondary | ICD-10-CM | POA: Diagnosis not present

## 2024-07-15 DIAGNOSIS — I10 Essential (primary) hypertension: Secondary | ICD-10-CM | POA: Diagnosis not present

## 2024-07-15 DIAGNOSIS — Z0000 Encounter for general adult medical examination without abnormal findings: Secondary | ICD-10-CM | POA: Diagnosis not present

## 2024-07-15 DIAGNOSIS — E7800 Pure hypercholesterolemia, unspecified: Secondary | ICD-10-CM | POA: Diagnosis not present

## 2024-07-15 DIAGNOSIS — Z1331 Encounter for screening for depression: Secondary | ICD-10-CM | POA: Diagnosis not present

## 2024-07-15 DIAGNOSIS — R7303 Prediabetes: Secondary | ICD-10-CM | POA: Diagnosis not present
